# Patient Record
Sex: Male | Born: 1965 | Race: White | Smoking: Never smoker
Health system: Southern US, Community
[De-identification: ages and names within clinical notes are randomized; demographics above are authoritative.]

---

## 2011-07-07 ENCOUNTER — Ambulatory Visit (INDEPENDENT_AMBULATORY_CARE_PROVIDER_SITE_OTHER): Payer: 59 | Admitting: Psychology

## 2011-07-07 DIAGNOSIS — F4322 Adjustment disorder with anxiety: Secondary | ICD-10-CM

## 2011-08-15 ENCOUNTER — Telehealth (HOSPITAL_BASED_OUTPATIENT_CLINIC_OR_DEPARTMENT_OTHER): Payer: Self-pay | Admitting: Radiology

## 2011-08-15 ENCOUNTER — Ambulatory Visit (HOSPITAL_BASED_OUTPATIENT_CLINIC_OR_DEPARTMENT_OTHER): Payer: 59 | Attending: Internal Medicine | Admitting: Radiology

## 2011-08-15 ENCOUNTER — Encounter (HOSPITAL_BASED_OUTPATIENT_CLINIC_OR_DEPARTMENT_OTHER): Payer: Self-pay | Admitting: Radiology

## 2011-08-15 VITALS — Ht 68.0 in | Wt 152.0 lb

## 2011-08-15 DIAGNOSIS — G4733 Obstructive sleep apnea (adult) (pediatric): Secondary | ICD-10-CM | POA: Insufficient documentation

## 2011-08-15 NOTE — Progress Notes (Signed)
Pt is a type one diabetic and has to check his glucose level frequently and is currently checking his glucose level.  Pt checking glucose levels Pt is a type one diabetic and has to check his glucose level frequently and is currently checking his glucose level.

## 2011-08-16 NOTE — Progress Notes (Signed)
Pt took two glucose tablets to help stabilize his sugar levels.

## 2011-08-19 DIAGNOSIS — G4733 Obstructive sleep apnea (adult) (pediatric): Secondary | ICD-10-CM

## 2011-08-19 NOTE — Procedures (Signed)
NAME:  Casey Reeves, GOYNES NO.:  [id number]  MEDICAL RECORD NO.:  [id number]          PATIENT TYPE:  OUT  LOCATION:  SLEEP CENTER                 FACILITY:  [facility]  PHYSICIAN:  Emrie Gayle D. Maple Hudson, MD, FCCP, FACPDATE OF BIRTH:  01-09[birth year]  DATE OF STUDY:  08/15/2011                           NOCTURNAL POLYSOMNOGRAM  REFERRING PHYSICIAN:  Gwen Pounds, MD  REFERRING PHYSICIAN:  Gwen Pounds, MD  INDICATION FOR STUDY:  Hypersomnia with sleep apnea.  EPWORTH SLEEPINESS SCORE:  6/24.  BMI 23.1, weight 152 pounds, height 68 inches, neck 13 inches.  HOME MEDICATIONS:  Charted and reviewed.  SLEEP ARCHITECTURE:  Total sleep time 259.5 minutes with sleep efficiency 70.9%.  Stage I 6.2%, stage II 68%, stage III 0.4%, REM 25.4% of total sleep time.  Sleep latency 49 minutes, REM latency 104 minutes, awake after sleep onset 58 minutes, arousal index 19.  BEDTIME MEDICATION:  None.  RESPIRATORY DATA:  Apnea/hypopnea index (AHI) 10.4 per hour.  A total of 45 events were scored including 22 obstructive apneas and 23 hypopneas. Events were not positional.  REM AHI 26.4 per hour.  This is a diagnostic NPSG protocol as ordered.  OXYGEN DATA:  Moderate snoring with oxygen desaturation to a nadir of 86% and a mean oxygen saturation through the study of 95.3% on room air. A total of 0.4 minutes was recorded through the total recording time, with oxygen saturation less than 88% on room air.  CARDIAC DATA:  Sinus rhythm with occasional PVC.  MOVEMENT-PARASOMNIA:  No significant movement disturbance.  Bathroom x1.  IMPRESSION-RECOMMENDATION: 1. Some difficulty maintaining sleep until approximately 1:00 a.m.     This might be associated with complaints of insomnia in the home     environment if it is a habitual pattern. 2. Mild obstructive sleep apnea/hypopnea syndrome, AHI 10.4 per hour     with nonpositional events.  Moderate snoring with oxygen     desaturation to a nadir of 86%  on room air and mean oxygen     saturation through the study of 95.3% on room air. 3. CPAP split protocol titration was not ordered for this study.     Scores in this range may be addressed with CPAP if more     conservative measures such as weight loss, treatment for nasal     congestion, and encouragement to sleep     off flat of back are insufficient.  Arrangements can be made for     return for dedicated CPAP titration study if appropriate.     Johny Pitstick D. Maple Hudson, MD, [facility], FACP Diplomate, Biomedical engineer of Sleep Medicine Electronically Signed    CDY/MEDQ  D:  08/19/2011 17:55:47  T:  08/19/2011 18:56:07  Job:  [id number]

## 2011-09-04 ENCOUNTER — Encounter (HOSPITAL_BASED_OUTPATIENT_CLINIC_OR_DEPARTMENT_OTHER): Payer: 59

## 2014-04-02 ENCOUNTER — Telehealth: Payer: Self-pay | Admitting: Neurology

## 2014-04-02 DIAGNOSIS — R0683 Snoring: Secondary | ICD-10-CM

## 2014-04-02 NOTE — Telephone Encounter (Signed)
Patient requesting letter from Dr. Vickey Hugerohmeier be sent to Dr. Ferd Hibbsusso's office stating that she approves the device for sleep apnea and teeth grinding so that Dr. Timothy Lassousso can write a prescription for it.  Dr. Timothy Lassousso will not write a prescription until he gets the okay from Dr. Vickey Hugerohmeier. The patient's insurance runs out at the end of the month and he needs this done ASAP.  Please call to advise.

## 2014-04-03 NOTE — Telephone Encounter (Signed)
Patient is a candidate for a dental device. Please attach the report and I can make a dental referral. CD

## 2014-04-03 NOTE — Telephone Encounter (Signed)
Faxed Dr. Oliva Bustardohmeier's approval [facility]    Attach the sleep study report?

## 2014-04-06 NOTE — Telephone Encounter (Signed)
Yes, please.  thanks

## 2016-12-20 DIAGNOSIS — E782 Mixed hyperlipidemia: Secondary | ICD-10-CM | POA: Insufficient documentation

## 2016-12-20 DIAGNOSIS — E559 Vitamin D deficiency, unspecified: Secondary | ICD-10-CM | POA: Insufficient documentation

## 2016-12-20 DIAGNOSIS — Z9641 Presence of insulin pump (external) (internal): Secondary | ICD-10-CM | POA: Insufficient documentation

## 2016-12-20 DIAGNOSIS — E1065 Type 1 diabetes mellitus with hyperglycemia: Secondary | ICD-10-CM | POA: Insufficient documentation

## 2018-09-30 ENCOUNTER — Emergency Department (HOSPITAL_COMMUNITY)
Admission: EM | Admit: 2018-09-30 | Discharge: 2018-09-30 | Disposition: A | Discharge status: Home / Self Care | Payer: BLUE CROSS/BLUE SHIELD | Attending: Emergency Medicine | Admitting: Emergency Medicine

## 2018-09-30 ENCOUNTER — Encounter (HOSPITAL_COMMUNITY): Payer: Self-pay

## 2018-09-30 ENCOUNTER — Emergency Department (HOSPITAL_COMMUNITY): Payer: BLUE CROSS/BLUE SHIELD

## 2018-09-30 DIAGNOSIS — M25521 Pain in right elbow: Secondary | ICD-10-CM | POA: Insufficient documentation

## 2018-09-30 DIAGNOSIS — E119 Type 2 diabetes mellitus without complications: Secondary | ICD-10-CM | POA: Insufficient documentation

## 2018-09-30 DIAGNOSIS — M25531 Pain in right wrist: Secondary | ICD-10-CM | POA: Diagnosis not present

## 2018-09-30 DIAGNOSIS — Y92410 Unspecified street and highway as the place of occurrence of the external cause: Secondary | ICD-10-CM | POA: Insufficient documentation

## 2018-09-30 DIAGNOSIS — S060X0A Concussion without loss of consciousness, initial encounter: Secondary | ICD-10-CM | POA: Diagnosis not present

## 2018-09-30 DIAGNOSIS — Z794 Long term (current) use of insulin: Secondary | ICD-10-CM | POA: Diagnosis not present

## 2018-09-30 DIAGNOSIS — M545 Low back pain: Secondary | ICD-10-CM | POA: Diagnosis not present

## 2018-09-30 DIAGNOSIS — Z79899 Other long term (current) drug therapy: Secondary | ICD-10-CM | POA: Diagnosis not present

## 2018-09-30 DIAGNOSIS — Y999 Unspecified external cause status: Secondary | ICD-10-CM | POA: Insufficient documentation

## 2018-09-30 DIAGNOSIS — Y9389 Activity, other specified: Secondary | ICD-10-CM | POA: Insufficient documentation

## 2018-09-30 DIAGNOSIS — S0990XA Unspecified injury of head, initial encounter: Secondary | ICD-10-CM | POA: Diagnosis present

## 2018-09-30 DIAGNOSIS — M25561 Pain in right knee: Secondary | ICD-10-CM | POA: Insufficient documentation

## 2018-09-30 MED ORDER — KETOROLAC TROMETHAMINE 30 MG/ML IJ SOLN
15.0000 mg | Freq: Once | INTRAMUSCULAR | Status: AC
  Administered 2018-09-30: 15 mg via INTRAMUSCULAR
  Filled 2018-09-30: qty 1

## 2018-09-30 MED ORDER — IBUPROFEN 400 MG PO TABS: 400.0000 mg | ORAL_TABLET | Freq: Three times a day (TID) | ORAL | 0 refills | Status: AC
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
## 2018-09-30 NOTE — ED Notes (Signed)
Pt transported to xray 

## 2018-09-30 NOTE — ED Notes (Signed)
ED Provider at bedside. 
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
## 2018-10-15 ENCOUNTER — Other Ambulatory Visit: Payer: Self-pay | Admitting: Specialist

## 2018-10-15 DIAGNOSIS — R4586 Emotional lability: Secondary | ICD-10-CM

## 2018-10-15 DIAGNOSIS — F0781 Postconcussional syndrome: Secondary | ICD-10-CM

## 2019-06-17 DIAGNOSIS — E1142 Type 2 diabetes mellitus with diabetic polyneuropathy: Secondary | ICD-10-CM | POA: Insufficient documentation

## 2019-10-09 ENCOUNTER — Ambulatory Visit: Payer: BLUE CROSS/BLUE SHIELD | Attending: Internal Medicine

## 2019-10-09 ENCOUNTER — Other Ambulatory Visit: Payer: Self-pay

## 2019-10-09 DIAGNOSIS — Z20822 Contact with and (suspected) exposure to covid-19: Secondary | ICD-10-CM

## 2019-10-09 DIAGNOSIS — Z20828 Contact with and (suspected) exposure to other viral communicable diseases: Secondary | ICD-10-CM | POA: Insufficient documentation

## 2019-10-11 LAB — NOVEL CORONAVIRUS, NAA: SARS-CoV-2, NAA: NOT DETECTED

## 2019-11-25 ENCOUNTER — Other Ambulatory Visit: Payer: Self-pay | Admitting: Internal Medicine

## 2019-11-25 DIAGNOSIS — R109 Unspecified abdominal pain: Secondary | ICD-10-CM

## 2019-12-05 ENCOUNTER — Ambulatory Visit
Admission: RE | Admit: 2019-12-05 | Discharge: 2019-12-05 | Disposition: A | Discharge status: Home / Self Care | Payer: BC Managed Care – PPO | Source: Ambulatory Visit | Attending: Internal Medicine | Admitting: Internal Medicine

## 2019-12-05 DIAGNOSIS — R109 Unspecified abdominal pain: Secondary | ICD-10-CM

## 2021-06-06 IMAGING — US US ABDOMEN COMPLETE
1 series · 14 of 25 positions shown · non-contrast
Comparison: None.

CLINICAL DATA: Abdominal pain.

EXAM:
ABDOMEN ULTRASOUND COMPLETE

[Series 1: us abdomen complete · 0.17mm/px · 14 of 86 slices shown]
[im 1/86]
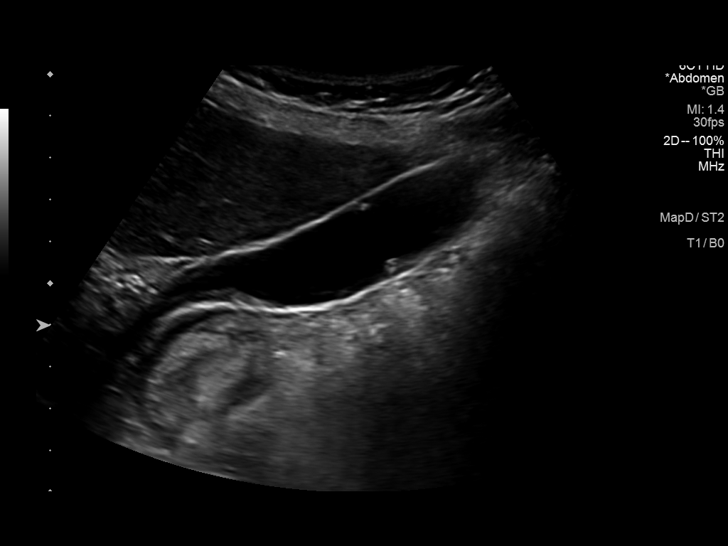
[im 8/86]
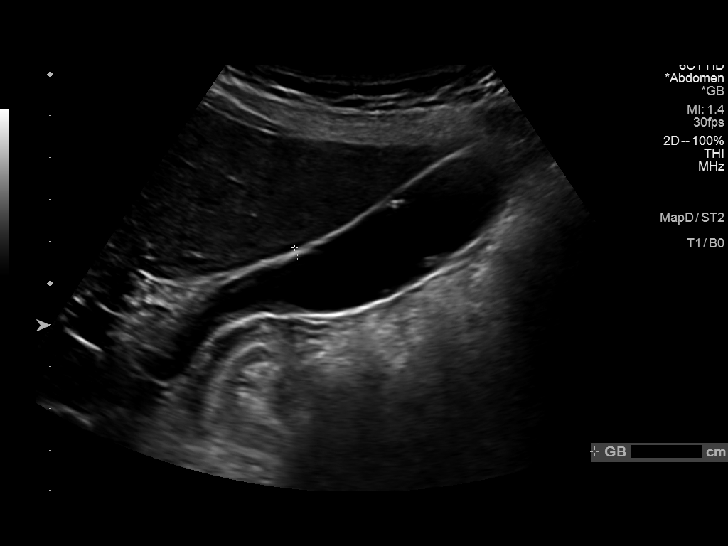
[im 15/86]
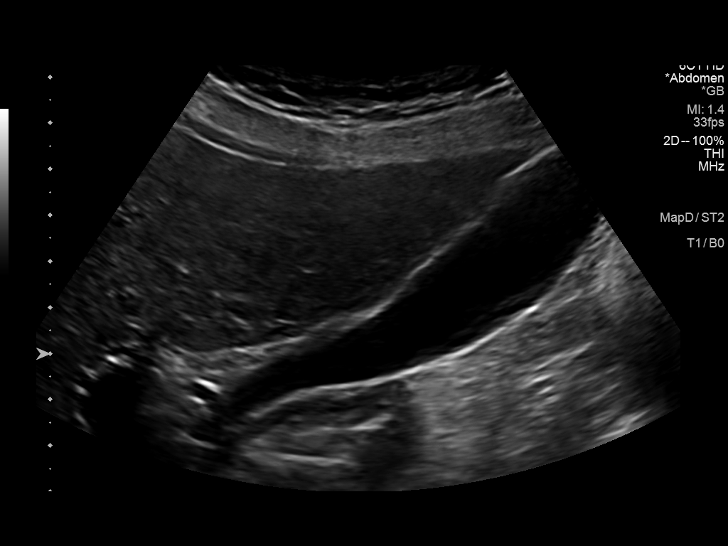
[im 22/86]
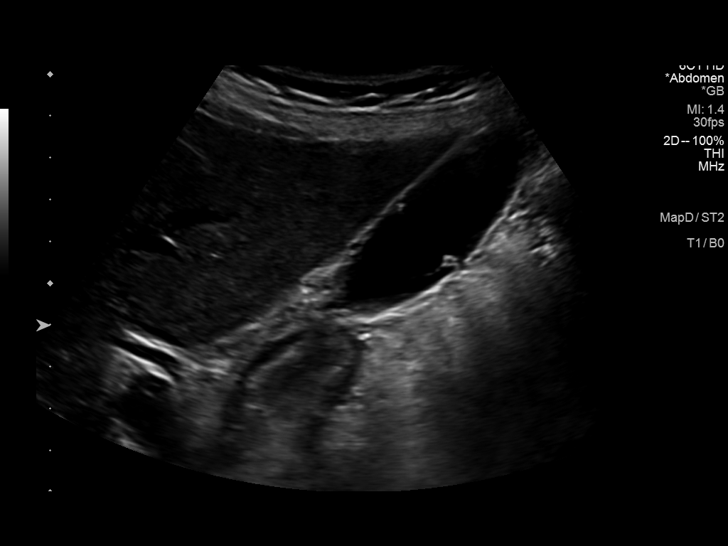
[im 29/86]
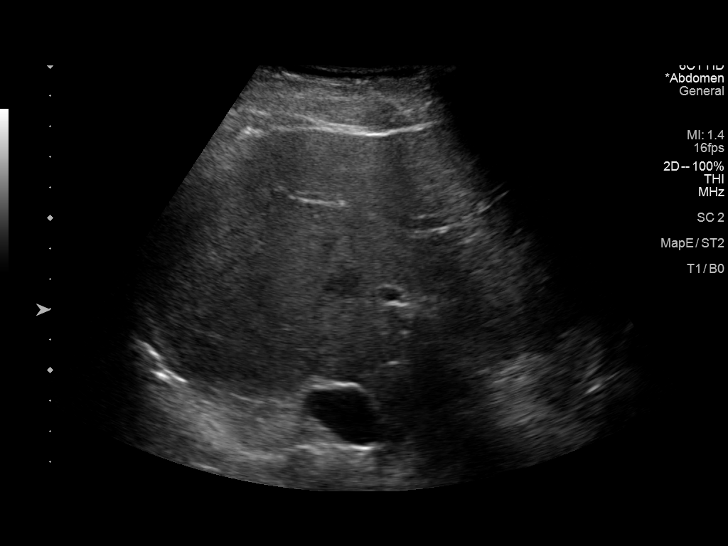
[im 32/86]
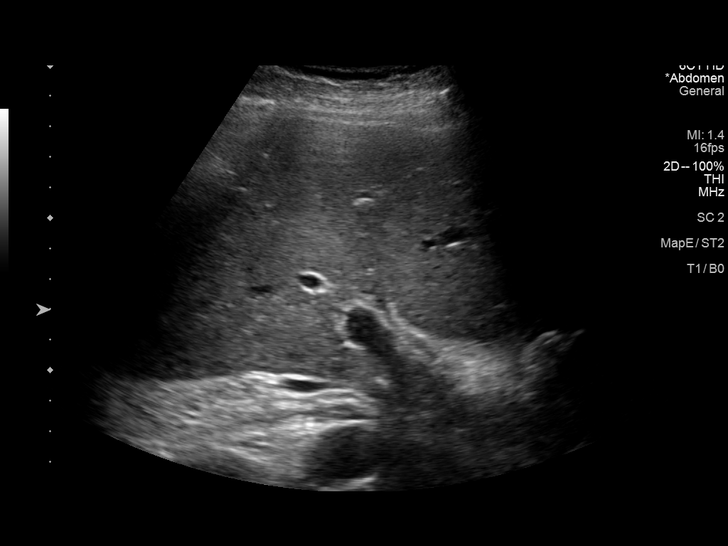
[im 39/86]
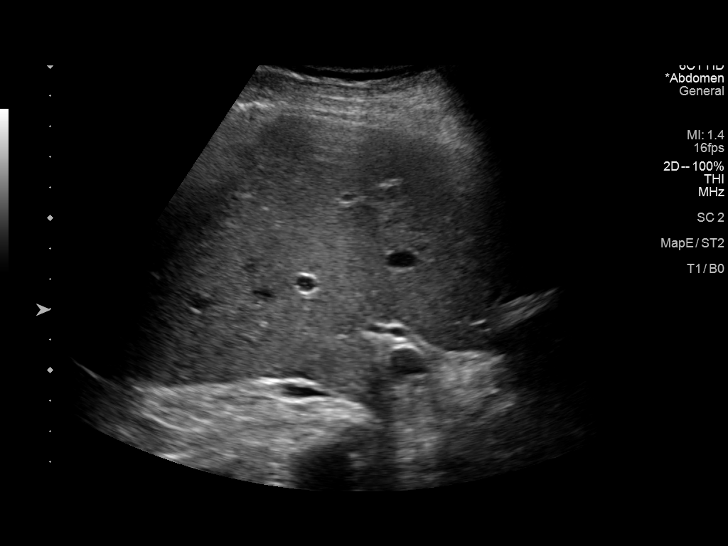
[im 47/86]
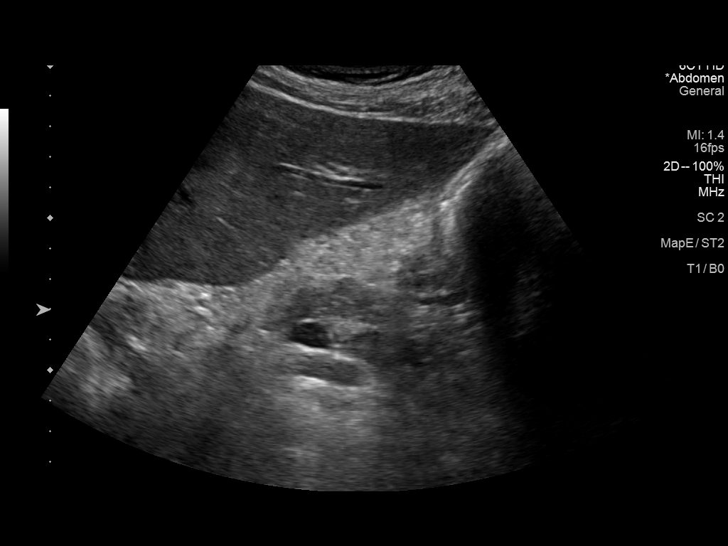
[im 54/86]
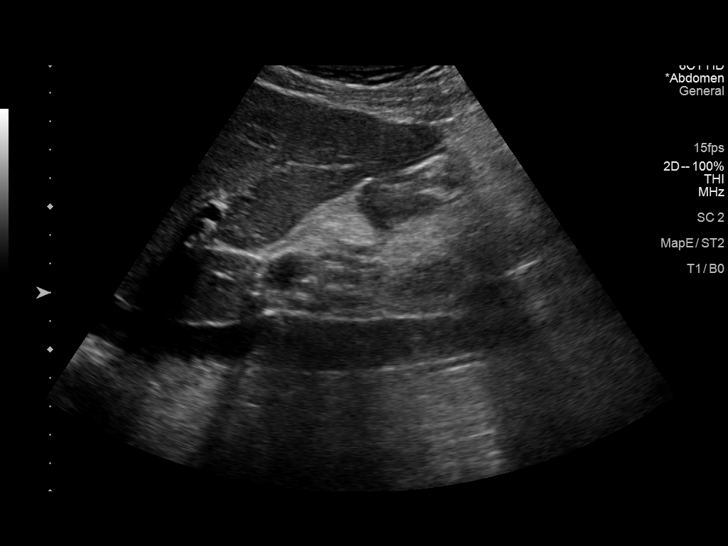
[im 57/86]
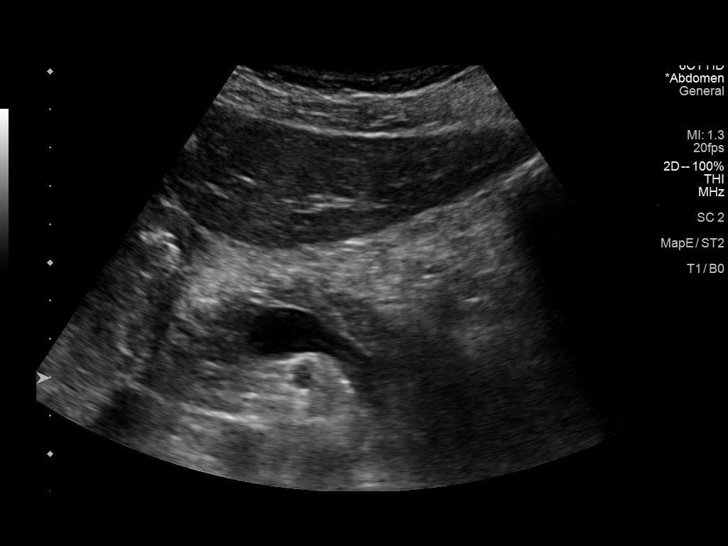
[im 64/86]
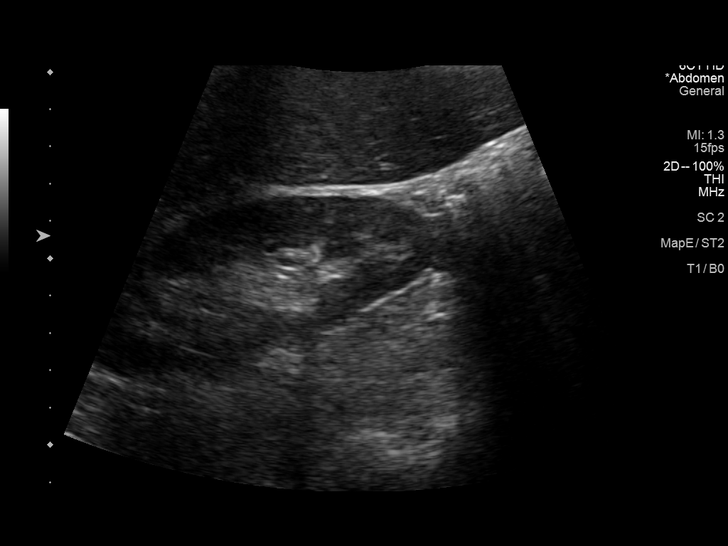
[im 71/86]
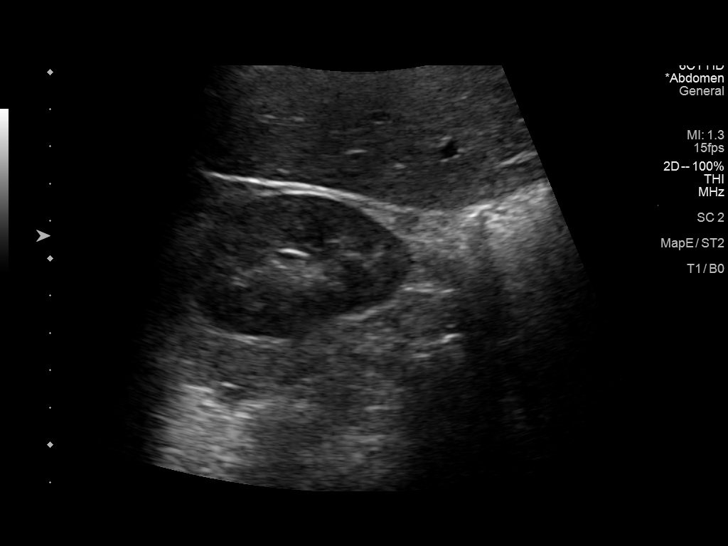
[im 78/86]
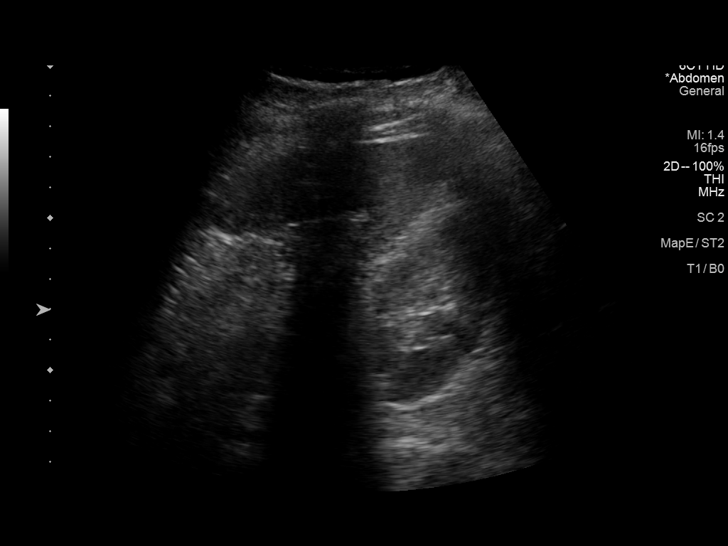
[im 86/86]
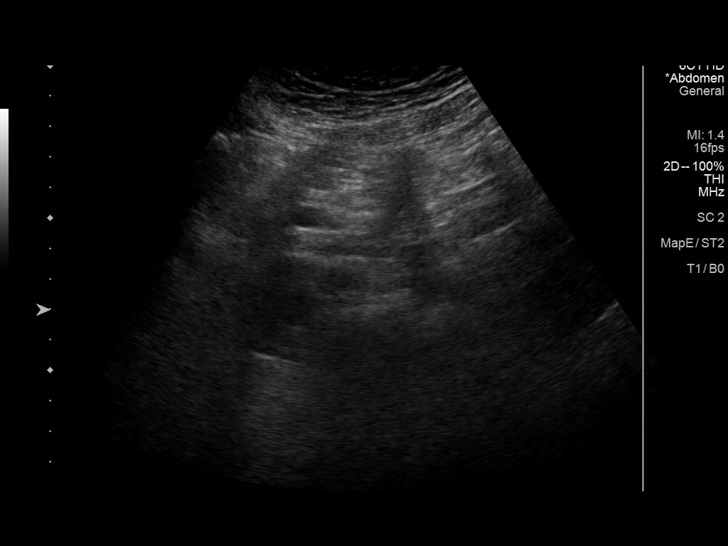

[14 of 25 positions shown; findings below may reference images not displayed]

FINDINGS: Gallbladder: Probable small polyp measuring 4 mm. This is most
compatible with benign process given size. No gallbladder wall
thickening or pericholecystic fluid. Negative sonographic Murphy
sign.

Common bile duct: Diameter: 3 mm

Liver: No focal lesion identified. Within normal limits in
parenchymal echogenicity. Portal vein is patent on color Doppler
imaging with normal direction of blood flow towards the liver.

IVC: No abnormality visualized.

Pancreas: Partially visualized.

Spleen: Size and appearance within normal limits.

Right Kidney: Length: 10.0 cm. Echogenicity within normal limits. No
mass or hydronephrosis visualized.

Left Kidney: Length: 12.3 cm. Echogenicity within normal limits. No
mass or hydronephrosis visualized.

Abdominal aorta: No aneurysm visualized.

Other findings: None.
IMPRESSION: No acute process.

Gallbladder polyp.

## 2021-06-20 DIAGNOSIS — M26609 Unspecified temporomandibular joint disorder, unspecified side: Secondary | ICD-10-CM | POA: Insufficient documentation
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
## 2022-05-09 ENCOUNTER — Ambulatory Visit (INDEPENDENT_AMBULATORY_CARE_PROVIDER_SITE_OTHER): Payer: BC Managed Care – PPO | Admitting: Family Medicine

## 2022-05-09 VITALS — BP 112/62 | HR 97 | Ht 68.0 in

## 2022-05-09 DIAGNOSIS — M9902 Segmental and somatic dysfunction of thoracic region: Secondary | ICD-10-CM

## 2022-05-09 DIAGNOSIS — M2550 Pain in unspecified joint: Secondary | ICD-10-CM | POA: Diagnosis not present

## 2022-05-09 DIAGNOSIS — M9901 Segmental and somatic dysfunction of cervical region: Secondary | ICD-10-CM | POA: Diagnosis not present

## 2022-05-09 DIAGNOSIS — M25552 Pain in left hip: Secondary | ICD-10-CM

## 2022-05-09 DIAGNOSIS — M5030 Other cervical disc degeneration, unspecified cervical region: Secondary | ICD-10-CM | POA: Diagnosis not present

## 2022-05-09 DIAGNOSIS — M9904 Segmental and somatic dysfunction of sacral region: Secondary | ICD-10-CM | POA: Diagnosis not present

## 2022-05-09 DIAGNOSIS — M9908 Segmental and somatic dysfunction of rib cage: Secondary | ICD-10-CM

## 2022-05-09 DIAGNOSIS — M5136 Other intervertebral disc degeneration, lumbar region: Secondary | ICD-10-CM | POA: Insufficient documentation

## 2022-05-09 DIAGNOSIS — M9903 Segmental and somatic dysfunction of lumbar region: Secondary | ICD-10-CM | POA: Diagnosis not present

## 2022-05-09 DIAGNOSIS — M546 Pain in thoracic spine: Secondary | ICD-10-CM

## 2022-05-09 DIAGNOSIS — M545 Low back pain, unspecified: Secondary | ICD-10-CM

## 2022-05-09 DIAGNOSIS — M542 Cervicalgia: Secondary | ICD-10-CM

## 2022-05-09 DIAGNOSIS — F988 Other specified behavioral and emotional disorders with onset usually occurring in childhood and adolescence: Secondary | ICD-10-CM | POA: Insufficient documentation

## 2022-05-09 DIAGNOSIS — M25551 Pain in right hip: Secondary | ICD-10-CM

## 2022-05-09 NOTE — Assessment & Plan Note (Addendum)
Pleasant discussed with patient patient is having discomfort and pain for quite some time.  Discussed which activities to doing which ones to avoid.  Increase activity slowly otherwise.  Follow-up again in 7 to 8 weeks Patient did learn a lot of exercises from athletic trainer and took significant amount of time to discuss on printing the laboratory and x-rays ordered.  Patient will go to another place.  Took quite some time to fix and get with patient wanted.  Total time 55 minutes with reviewing, discussed with patient, and time he was in our office.

## 2022-05-09 NOTE — Assessment & Plan Note (Signed)

## 2022-05-09 NOTE — Patient Instructions (Addendum)
Exercises 3x a week Tried manipulation today Labs today See me again in 6 weeks

## 2022-05-09 NOTE — Assessment & Plan Note (Signed)
Patient has had some difficulty.  Is seen multiple different other providers for different things.  Patient has had epidurals he states in the neck previously and does state that it does make some improvement.  Patient describes the pain as fairly severe though overall.  We attempted osteopathic manipulation today.  Patient would like to avoid any other advanced imaging at the moment.  Discussed home exercises and icing regimen.  Follow-up again in 6 to 8 weeks.

## 2022-05-10 ENCOUNTER — Encounter: Payer: Self-pay | Admitting: Family Medicine

## 2022-05-17 ENCOUNTER — Other Ambulatory Visit: Payer: Self-pay | Admitting: Family Medicine

## 2022-05-20 LAB — RHEUMATOID FACTOR: Rheumatoid fact SerPl-aCnc: <14 IU/mL (ref <14)

## 2022-05-20 LAB — CBC WITH DIFFERENTIAL/PLATELET
Absolute Monocytes: 803 cells/uL (ref 200–950)
Basophils Absolute: 21 cells/uL (ref 0–200)
Basophils Relative: 0.2 %
Eosinophils Absolute: 75 cells/uL (ref 15–500)
Eosinophils Relative: 0.7 %
HCT: 48.6 % (ref 38.5–50.0)
Hemoglobin: 15.7 g/dL (ref 13.2–17.1)
Lymphs Abs: 2568 cells/uL (ref 850–3900)
MCH: 28.5 pg (ref 27.0–33.0)
MCHC: 32.3 g/dL (ref 32.0–36.0)
MCV: 88.2 fL (ref 80.0–100.0)
MPV: 9.7 fL (ref 7.5–12.5)
Monocytes Relative: 7.5 %
Neutro Abs: 7233 cells/uL (ref 1500–7800)
Neutrophils Relative %: 67.6 %
Platelets: 323 10*3/uL (ref 140–400)
RBC: 5.51 10*6/uL (ref 4.20–5.80)
RDW: 12 % (ref 11.0–15.0)
Total Lymphocyte: 24 %
WBC: 10.7 10*3/uL (ref 3.8–10.8)

## 2022-05-20 LAB — COMPLETE METABOLIC PANEL WITH GFR
AG Ratio: 1.5 (calc) (ref 1.0–2.5)
ALT: 26 U/L (ref 9–46)
AST: 15 U/L (ref 10–35)
Albumin: 3.7 g/dL (ref 3.6–5.1)
Alkaline phosphatase (APISO): 82 U/L (ref 35–144)
BUN: 17 mg/dL (ref 7–25)
CO2: 28 mmol/L (ref 20–32)
Calcium: 9.3 mg/dL (ref 8.6–10.3)
Chloride: 102 mmol/L (ref 98–110)
Creat: 0.95 mg/dL (ref 0.70–1.30)
Globulin: 2.4 g/dL (calc) (ref 1.9–3.7)
Glucose, Bld: 189 mg/dL — ABNORMAL HIGH (ref 65–139)
Potassium: 4.1 mmol/L (ref 3.5–5.3)
Sodium: 139 mmol/L (ref 135–146)
Total Bilirubin: 0.5 mg/dL (ref 0.2–1.2)
Total Protein: 6.1 g/dL (ref 6.1–8.1)
eGFR: 93 mL/min/{1.73_m2} (ref >=60)

## 2022-05-20 LAB — IRON,TIBC AND FERRITIN PANEL
%SAT: 27 % (calc) (ref 20–48)
Ferritin: 22 ng/mL — ABNORMAL LOW (ref 38–380)
Iron: 79 ug/dL (ref 50–180)
TIBC: 291 mcg/dL (calc) (ref 250–425)

## 2022-05-20 LAB — SEDIMENTATION RATE: Sed Rate: 2 mm/h (ref 0–20)

## 2022-05-20 LAB — TSH: TSH: 1.94 mIU/L (ref 0.40–4.50)

## 2022-05-20 LAB — ANA: Anti Nuclear Antibody (ANA): NEGATIVE

## 2022-05-20 LAB — PTH, INTACT AND CALCIUM
Calcium: 9.3 mg/dL (ref 8.6–10.3)
PTH: 29 pg/mL (ref 16–77)

## 2022-05-20 LAB — URIC ACID: Uric Acid, Serum: 3.6 mg/dL — ABNORMAL LOW (ref 4.0–8.0)

## 2022-05-20 LAB — TESTOSTERONE: Testosterone: 444 ng/dL (ref 250–827)

## 2022-05-20 LAB — C-REACTIVE PROTEIN: CRP: 0.2 mg/L (ref <8.0)

## 2022-05-20 LAB — VITAMIN D 25 HYDROXY (VIT D DEFICIENCY, FRACTURES): Vit D, 25-Hydroxy: 37 ng/mL (ref 30–100)

## 2022-05-20 LAB — CYCLIC CITRUL PEPTIDE ANTIBODY, IGG: Cyclic Citrullin Peptide Ab: <16 UNITS

## 2022-05-20 LAB — CALCIUM, IONIZED: Calcium, Ion: 5 mg/dL (ref 4.7–5.5)

## 2022-05-20 LAB — ANGIOTENSIN CONVERTING ENZYME: Angiotensin-Converting Enzyme: 27 U/L (ref 9–67)

## 2022-06-01 ENCOUNTER — Encounter: Payer: Self-pay | Admitting: Family Medicine

## 2022-06-16 NOTE — Progress Notes (Unsigned)
[facility] [address] [place] [address] Phone: [phone] Subjective:   INadine Counts, am serving as a scribe for Dr. Antoine Primas.  I'm seeing this patient by the request  of:  Creola Corn, MD  CC: Back and neck pain follow-up  [id number]  Casey Reeves is a 56 y.o. male coming in with complaint of back and neck pain. OMT on 05/09/2022. Wants to go over lab results.  Laboratory work-up likely did not show any type of autoimmune and was mostly regular range except for ferritin being minorly low at 22.  Patient was also to get xrays which he never did secondary to financial constraints.  Patient states doing well today. Stiffness in neck, but has been improving. Wants manipulation today. Jaw pain.  Medications patient has been prescribed: None  T         Reviewed prior external information including notes and imaging from previsou exam, outside providers and external EMR if available.   As well as notes that were available from care everywhere and other healthcare systems.  Past medical history, social, surgical and family history all reviewed in electronic medical record.  No pertanent information unless stated regarding to the chief complaint.   Past Medical History:  Diagnosis Date   Diabetes mellitus     Allergies  Allergen Reactions   Ace Inhibitors Cough and Other (See Comments)    Other reaction(s): Other (See Comments) Other reaction(s): Other (See Comments)  Other reaction(s): Other (See Comments) Other reaction(s): Other (See Comments) Other reaction(s): Other (See Comments) Other reaction(s): Other (See Comments) Other reaction(s): Other (See Comments)      Review of Systems:  No headache, visual changes, nausea, vomiting, diarrhea, constipation, dizziness, abdominal pain, skin rash, fevers, chills, night sweats, weight loss, swollen lymph nodes, body aches, joint swelling, chest pain, shortness of breath,  mood changes. POSITIVE muscle aches  Objective  Blood pressure 110/70, pulse 78, height 5\' 8"  (1.727 m), weight 157 lb (71.2 kg), SpO2 97 %.   General: No apparent distress alert and oriented x3 mood and affect normal, dressed appropriately.  HEENT: Pupils equal, extraocular movements intact  Respiratory: Patient's speak in full sentences and does not appear short of breath  Cardiovascular: No lower extremity edema, non tender, no erythema  Gait MSK:  Back does have some loss of lordosis.  Significant decrease in sidebending of the neck and extension.  Patient also has some limited range low back in flexion and extension.  Osteopathic findings C3 flexed rotated and side bent right C5 flexed rotated and side bent left T5 extended rotated and side bent left inhaled rib T7 extended rotated and side bent left L2 flexed rotated and side bent right Sacrum right on right     Assessment and Plan:  Degenerative disc disease, lumbar Chronic discomfort noted.  Did respond extremely well though to osteopathic manipulation.  Does is from outside facility previously.  Seems to be more changes in the neck and the lower back.  Discussed with patient about icing regimen and home exercises.  Discussed avoiding certain activities.  Increase activity as tolerated.  Follow-up again in 2 months    Nonallopathic problems  Decision today to treat with OMT was based on Physical Exam  After verbal consent patient was treated with HVLA, ME, FPR techniques in cervical, rib, thoracic, lumbar, and sacral  areas  Patient tolerated the procedure well with improvement in symptoms  Patient given exercises, stretches and lifestyle modifications  See medications in patient instructions if given  Patient will follow up in 4-8 weeks     The above documentation has been reviewed and is accurate and complete Judi Saa, DO         Note: This dictation was prepared with Dragon dictation along with  smaller phrase technology. Any transcriptional errors that result from this process are unintentional.

## 2022-06-19 ENCOUNTER — Ambulatory Visit (INDEPENDENT_AMBULATORY_CARE_PROVIDER_SITE_OTHER): Payer: BC Managed Care – PPO | Admitting: Family Medicine

## 2022-06-19 VITALS — BP 110/70 | HR 78 | Ht 68.0 in | Wt 157.0 lb

## 2022-06-19 DIAGNOSIS — M5136 Other intervertebral disc degeneration, lumbar region: Secondary | ICD-10-CM

## 2022-06-19 DIAGNOSIS — M9901 Segmental and somatic dysfunction of cervical region: Secondary | ICD-10-CM | POA: Diagnosis not present

## 2022-06-19 DIAGNOSIS — M9904 Segmental and somatic dysfunction of sacral region: Secondary | ICD-10-CM

## 2022-06-19 DIAGNOSIS — M9902 Segmental and somatic dysfunction of thoracic region: Secondary | ICD-10-CM

## 2022-06-19 DIAGNOSIS — M9908 Segmental and somatic dysfunction of rib cage: Secondary | ICD-10-CM

## 2022-06-19 DIAGNOSIS — M9903 Segmental and somatic dysfunction of lumbar region: Secondary | ICD-10-CM | POA: Diagnosis not present

## 2022-06-19 NOTE — Assessment & Plan Note (Signed)
Chronic discomfort noted.  Did respond extremely well though to osteopathic manipulation.  Does is from outside facility previously.  Seems to be more changes in the neck and the lower back.  Discussed with patient about icing regimen and home exercises.  Discussed avoiding certain activities.  Increase activity as tolerated.  Follow-up again in 2 months

## 2022-06-19 NOTE — Patient Instructions (Addendum)
Good to see you! Think we are making progress Check out Dr. Ottis Stain at [facility] for more soft tissue

## 2022-08-24 NOTE — Progress Notes (Signed)
  [facility] [address] [place] [address] Phone: [phone] Subjective:   Casey Reeves, am serving as a scribe for Dr. Antoine Primas.  I'm seeing this patient by the request  of:  Creola Corn, MD  CC: Back and neck pain follow-up  [id number]  Casey Reeves is a 56 y.o. male coming in with complaint of back and neck pain. OMT 06/19/2022. Patient states that he has been doing well since last visit. Has been stretching a lot since last visit.     Medications patient has been prescribed: None  Taking:         Reviewed prior external information including notes and imaging from previsou exam, outside providers and external EMR if available.   As well as notes that were available from care everywhere and other healthcare systems.  Past medical history, social, surgical and family history all reviewed in electronic medical record.  No pertanent information unless stated regarding to the chief complaint.   Past Medical History:  Diagnosis Date   Diabetes mellitus     Allergies  Allergen Reactions   Ace Inhibitors Cough and Other (See Comments)    Other reaction(s): Other (See Comments) Other reaction(s): Other (See Comments)  Other reaction(s): Other (See Comments) Other reaction(s): Other (See Comments) Other reaction(s): Other (See Comments) Other reaction(s): Other (See Comments) Other reaction(s): Other (See Comments)      Review of Systems:  No headache, visual changes, nausea, vomiting, diarrhea, constipation, dizziness, abdominal pain, skin rash, fevers, chills, night sweats, weight loss, swollen lymph nodes, body aches, joint swelling, chest pain, shortness of breath, mood changes. POSITIVE muscle aches  Objective  Blood pressure 122/82, pulse 60, height 5\' 8"  (1.727 m), weight 158 lb (71.7 kg), SpO2 99 %.   General: No apparent distress alert and oriented x3 mood and affect normal, dressed appropriately.   HEENT: Pupils equal, extraocular movements intact  Respiratory: Patient's speak in full sentences and does not appear short of breath  Cardiovascular: No lower extremity edema, non tender, no erythema  Back exam does have some mild loss of lordosis.  Still has some limitation of right-sided rotation of the neck noted.  Some limited left-sided sidebending.  Still has the tightness noted.  Osteopathic findings  C2 flexed rotated and side bent right C6 flexed rotated and side bent right  T3 extended rotated and side bent right inhaled rib T7 extended rotated and side bent left L1 flexed rotated and side bent right Sacrum right on right       Assessment and Plan:  Degenerative disc disease, lumbar DDD but overall doing well. Discussed HEP  Continue the home exercises  RTC in 6 weeks  Meloxicam 15mg  daily     Nonallopathic problems  Decision today to treat with OMT was based on Physical Exam  After verbal consent patient was treated with HVLA, ME, FPR techniques in cervical, rib, thoracic, lumbar, and sacral  areas  Patient tolerated the procedure well with improvement in symptoms  Patient given exercises, stretches and lifestyle modifications  See medications in patient instructions if given  Patient will follow up in 4-8 weeks     The above documentation has been reviewed and is accurate and complete , DO         Note: This dictation was prepared with Dragon dictation along with smaller phrase technology. Any transcriptional errors that result from this process are unintentional.

## 2022-08-28 ENCOUNTER — Encounter: Payer: Self-pay | Admitting: Family Medicine

## 2022-08-28 ENCOUNTER — Ambulatory Visit (INDEPENDENT_AMBULATORY_CARE_PROVIDER_SITE_OTHER): Payer: BC Managed Care – PPO | Admitting: Family Medicine

## 2022-08-28 VITALS — BP 122/82 | HR 60 | Ht 68.0 in | Wt 158.0 lb

## 2022-08-28 DIAGNOSIS — M9908 Segmental and somatic dysfunction of rib cage: Secondary | ICD-10-CM

## 2022-08-28 DIAGNOSIS — M9904 Segmental and somatic dysfunction of sacral region: Secondary | ICD-10-CM

## 2022-08-28 DIAGNOSIS — M9902 Segmental and somatic dysfunction of thoracic region: Secondary | ICD-10-CM | POA: Diagnosis not present

## 2022-08-28 DIAGNOSIS — M9903 Segmental and somatic dysfunction of lumbar region: Secondary | ICD-10-CM

## 2022-08-28 DIAGNOSIS — M9901 Segmental and somatic dysfunction of cervical region: Secondary | ICD-10-CM

## 2022-08-28 DIAGNOSIS — M5136 Other intervertebral disc degeneration, lumbar region: Secondary | ICD-10-CM

## 2022-08-28 NOTE — Assessment & Plan Note (Signed)
DDD but overall doing well. Discussed HEP  Continue the home exercises  RTC in 6 weeks  Meloxicam 15mg  daily

## 2022-08-28 NOTE — Patient Instructions (Signed)
Try to lay down on your back with arms overhead and let gravity help Keep doing band exercises Regan Rakers  See me again in 3 months

## 2022-12-04 ENCOUNTER — Ambulatory Visit: Payer: BC Managed Care – PPO | Admitting: Family Medicine

## 2023-02-13 NOTE — Progress Notes (Deleted)
  [facility] [address] [place] [address] Phone: [phone] Subjective:    I'm seeing this patient by the request  of:  Creola Corn, MD  CC:   [id number]  Casey Reeves is a 57 y.o. male coming in with complaint of back and neck pain. OMT 08/28/2022. Patient states   Medications patient has been prescribed: None  Taking:         Reviewed prior external information including notes and imaging from previsou exam, outside providers and external EMR if available.   As well as notes that were available from care everywhere and other healthcare systems.  Past medical history, social, surgical and family history all reviewed in electronic medical record.  No pertanent information unless stated regarding to the chief complaint.   Past Medical History:  Diagnosis Date   Diabetes mellitus     Allergies  Allergen Reactions   Ace Inhibitors Cough and Other (See Comments)    Other reaction(s): Other (See Comments) Other reaction(s): Other (See Comments)  Other reaction(s): Other (See Comments) Other reaction(s): Other (See Comments) Other reaction(s): Other (See Comments) Other reaction(s): Other (See Comments) Other reaction(s): Other (See Comments)      Review of Systems:  No headache, visual changes, nausea, vomiting, diarrhea, constipation, dizziness, abdominal pain, skin rash, fevers, chills, night sweats, weight loss, swollen lymph nodes, body aches, joint swelling, chest pain, shortness of breath, mood changes. POSITIVE muscle aches  Objective  There were no vitals taken for this visit.   General: No apparent distress alert and oriented x3 mood and affect normal, dressed appropriately.  HEENT: Pupils equal, extraocular movements intact  Respiratory: Patient's speak in full sentences and does not appear short of breath  Cardiovascular: No lower extremity edema, non tender, no erythema  Gait MSK:  Back    Osteopathic findings  C2 flexed rotated and side bent right C6 flexed rotated and side bent left T3 extended rotated and side bent right inhaled rib T9 extended rotated and side bent left L2 flexed rotated and side bent right Sacrum right on right       Assessment and Plan:  No problem-specific Assessment & Plan notes found for this encounter.    Nonallopathic problems  Decision today to treat with OMT was based on Physical Exam  After verbal consent patient was treated with HVLA, ME, FPR techniques in cervical, rib, thoracic, lumbar, and sacral  areas  Patient tolerated the procedure well with improvement in symptoms  Patient given exercises, stretches and lifestyle modifications  See medications in patient instructions if given  Patient will follow up in 4-8 weeks             Note: This dictation was prepared with Dragon dictation along with smaller phrase technology. Any transcriptional errors that result from this process are unintentional.

## 2023-02-26 ENCOUNTER — Ambulatory Visit: Payer: BC Managed Care – PPO | Admitting: Family Medicine
